# Patient Record
Sex: Male | Born: 2000 | Race: White | Hispanic: No | Marital: Single | Smoking: Never smoker
Health system: Southern US, Community
[De-identification: ages and names within clinical notes are randomized; demographics above are authoritative.]

## PROBLEM LIST (undated history)

## (undated) DIAGNOSIS — R55 Syncope and collapse: Secondary | ICD-10-CM

## (undated) DIAGNOSIS — J302 Other seasonal allergic rhinitis: Secondary | ICD-10-CM

## (undated) DIAGNOSIS — F900 Attention-deficit hyperactivity disorder, predominantly inattentive type: Secondary | ICD-10-CM

## (undated) DIAGNOSIS — F909 Attention-deficit hyperactivity disorder, unspecified type: Secondary | ICD-10-CM

## (undated) DIAGNOSIS — J3089 Other allergic rhinitis: Secondary | ICD-10-CM

## (undated) DIAGNOSIS — F39 Unspecified mood [affective] disorder: Secondary | ICD-10-CM

## (undated) DIAGNOSIS — Z0289 Encounter for other administrative examinations: Secondary | ICD-10-CM

## (undated) DIAGNOSIS — J310 Chronic rhinitis: Secondary | ICD-10-CM

## (undated) DIAGNOSIS — J45909 Unspecified asthma, uncomplicated: Secondary | ICD-10-CM

## (undated) DIAGNOSIS — J309 Allergic rhinitis, unspecified: Secondary | ICD-10-CM

## (undated) DIAGNOSIS — S67192A Crushing injury of right middle finger, initial encounter: Secondary | ICD-10-CM

## (undated) DIAGNOSIS — S62662A Nondisplaced fracture of distal phalanx of right middle finger, initial encounter for closed fracture: Secondary | ICD-10-CM

## (undated) DIAGNOSIS — Z209 Contact with and (suspected) exposure to unspecified communicable disease: Secondary | ICD-10-CM

## (undated) HISTORY — PX: NO PAST SURGERIES: SHX2092

---
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
## 2020-01-15 ENCOUNTER — Ambulatory Visit: Payer: Federal, State, Local not specified - PPO | Admitting: Mental Health

## 2020-01-15 NOTE — Progress Notes (Unsigned)
Psychotherapy Note  Name: Clifford Quinn Date: 12/19/19 MRN: [id number] DOB: 25-Jun-2001 PCP: Maudie Flakes, FNP  Time spent: 40 minutes  Treatment: Intervention  Mental Status Exam:   Appearance:   Casual     Behavior:  Appropriate  Motor:  Normal  Speech/Language:   Clear and Coherent  Affect:  Full Range  Mood:  euthymic  Thought process:  normal  Thought content:    WNL  Sensory/Perceptual disturbances:    WNL  Orientation:  x4  Attention:  Good  Concentration:  Good  Memory:  WNL  Fund of knowledge:   Good  Insight:    Good  Judgment:   Good  Impulse Control:  Good   Reported Symptoms:   Problems w/ focus/attention, procrastination, depressed mood, anxiety  Risk Assessment: Danger to Self:  No Self-injurious Behavior: No Danger to Others: No Duty to Warn:no Physical Aggression / Violence:No  Access to Firearms a concern: No  Gang Involvement:No  Patient / guardian was educated about steps to take if suicide or homicide risk level increases between visits: yes While future psychiatric events cannot be accurately predicted, the patient does not currently require acute inpatient psychiatric care and does not currently meet [facility] involuntary commitment criteria.    Medical History/Surgical History:  Past Medical History:  Diagnosis Date  . Syncope     Past Surgical History:  Procedure Laterality Date  . NO PAST SURGERIES      Medications: Current Outpatient Medications  Medication Sig Dispense Refill  . Diclofenac Sodium (PENNSAID) 2 % SOLN Place 1 application onto the skin 2 (two) times daily. (Patient not taking: Reported on 12/24/2019) 112 g 2  . VYVANSE 30 MG capsule Take 1 capsule by mouth daily.     No current facility-administered medications for this visit.    No Known Allergies  Subjective:   Patient presents for session, reporting he has been doing well, obtained another part-time job working in the evening until about 1 AM 5  days/week.  He stated he has had to cope with less sleep as he has an 8:00 class in the mornings but only has 2 weeks of class left and feels he can maintain this new job and school.  He continues to also work at his other job at the [employer].  He stated he continues to work out as this is one of his main outlets.  He struggles at times with focus and concentration while trying to get schoolwork completed.  He identified needing to be more cognizant of anything that would be a distraction during these instances.  He stated that his obtaining the second job has made his schedule even busier but this actually helps him stay more focused and effective in getting things done.  He denies feeling depressed recently, that he is between episodes continues to decline wanting to take any psychiatric medication to address depression but continues to take his medication to treat his ADHD.  He identified some of his thinking at times when stressed plays a role in his depressive episodes.  We provide him resource to review related to thought distortions to increase self awareness.  Interventions: CBT supportive therapy  Diagnoses:  No diagnosis found.  Plan: Patient is to use CBT, mindfulness and coping skills to help decrease symptoms.    Patient to maintain consistency with task completion by organizing his day, scheduling.  Patient to decrease procrastination by limiting exposure to any distractions that may cause him to get behind on  schoolwork.  Long-term goal:  Reduce overall level, frequency, and intensity of the feelings of depression and anxiety so that daily functioning is not impaired.  Short-term goal:  Maintaining a level of organization day-to-day to maintain consistency with his academic progress Verbalize an understanding of the role that distorted thinking plays in triggering and/or increasing his episodes of depression Patient to utilize coping skills as discussed in session Patient to  continue to take his psychiatric medication as clinically indicated and report any concerns to his prescribing provider  Assessment of progress:  progressing    [place], [facility]

## 2020-03-26 DIAGNOSIS — Z131 Encounter for screening for diabetes mellitus: Secondary | ICD-10-CM | POA: Diagnosis not present

## 2020-03-26 DIAGNOSIS — Z136 Encounter for screening for cardiovascular disorders: Secondary | ICD-10-CM | POA: Diagnosis not present

## 2020-03-26 DIAGNOSIS — Z0000 Encounter for general adult medical examination without abnormal findings: Secondary | ICD-10-CM | POA: Diagnosis not present

## 2020-03-26 DIAGNOSIS — Z1321 Encounter for screening for nutritional disorder: Secondary | ICD-10-CM | POA: Diagnosis not present

## 2020-03-26 DIAGNOSIS — Z130 Encounter for screening for diseases of the blood and blood-forming organs and certain disorders involving the immune mechanism: Secondary | ICD-10-CM | POA: Diagnosis not present

## 2020-03-26 DIAGNOSIS — Z1329 Encounter for screening for other suspected endocrine disorder: Secondary | ICD-10-CM | POA: Diagnosis not present

## 2020-05-28 DIAGNOSIS — R21 Rash and other nonspecific skin eruption: Secondary | ICD-10-CM | POA: Diagnosis not present

## 2020-06-02 IMAGING — DX DG SHOULDER 2+V*R*
3 series · 3 of 3 positions shown · non-contrast
Comparison: None.

CLINICAL DATA: Weight lifting injury.  Pain.

EXAM:
RIGHT SHOULDER - 2+ VIEW

[shoulder (grashey view) ap]
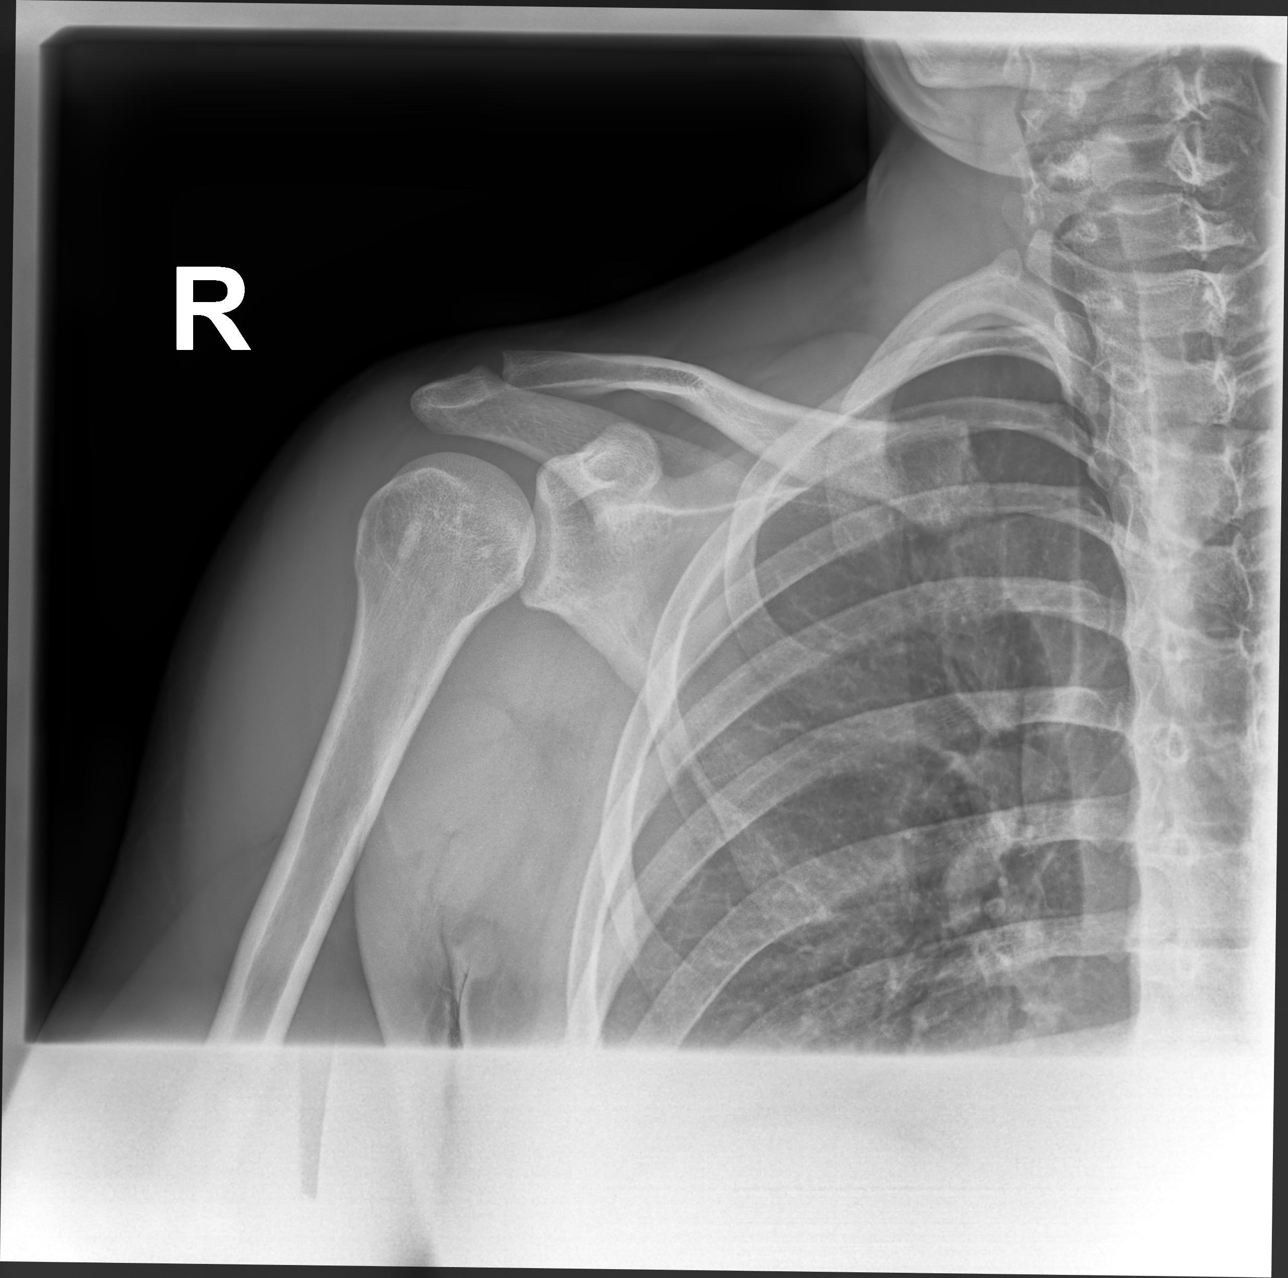

[shoulder (y view) lat]
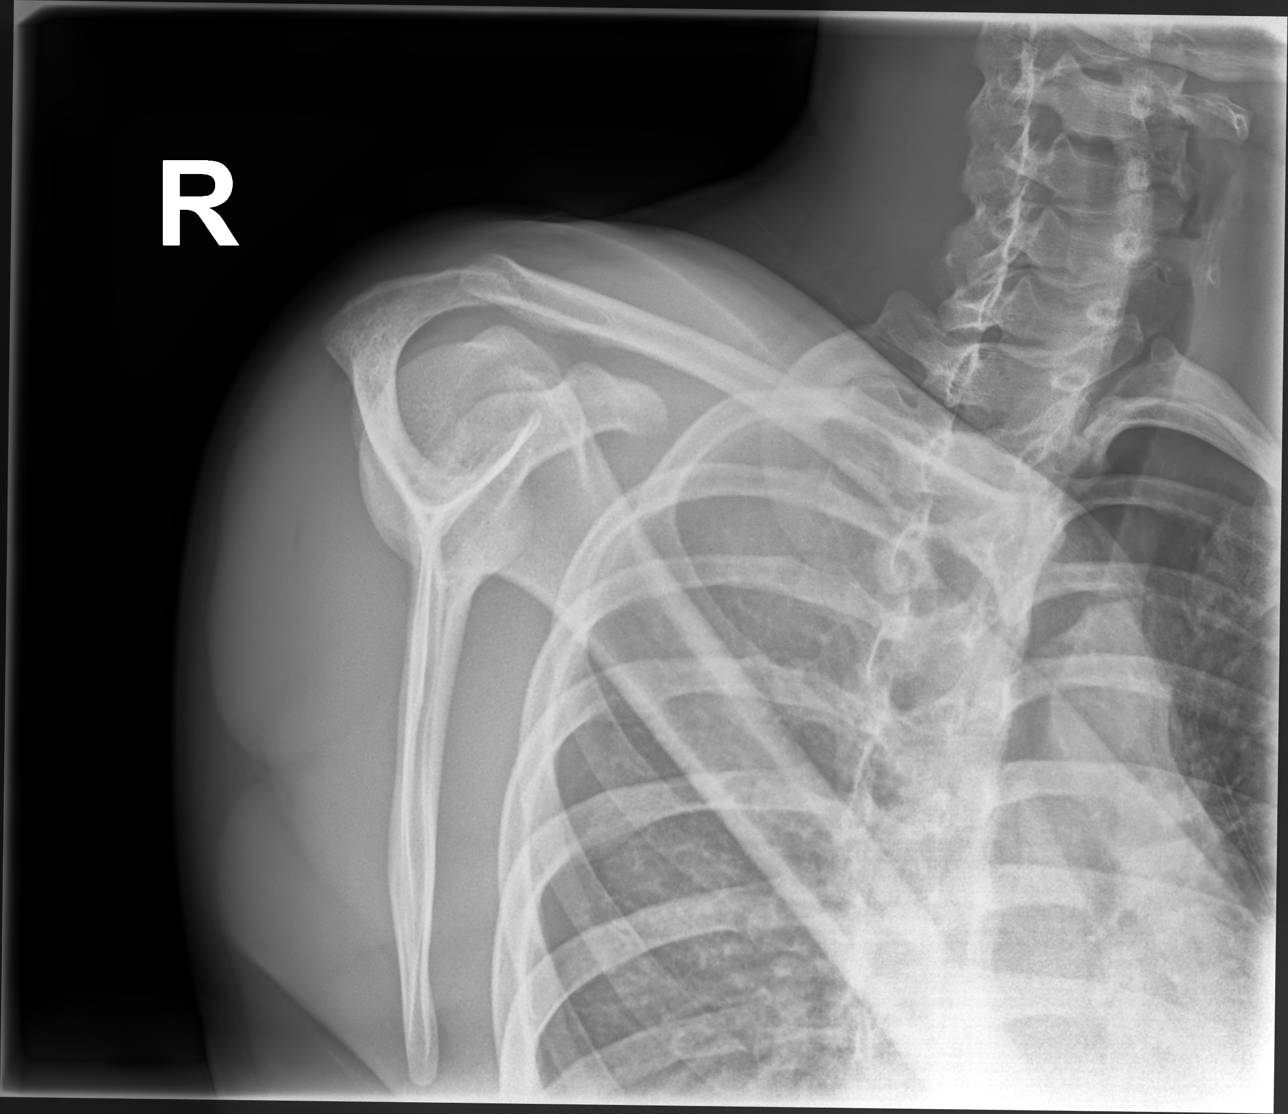

[shoulder axial 45°]
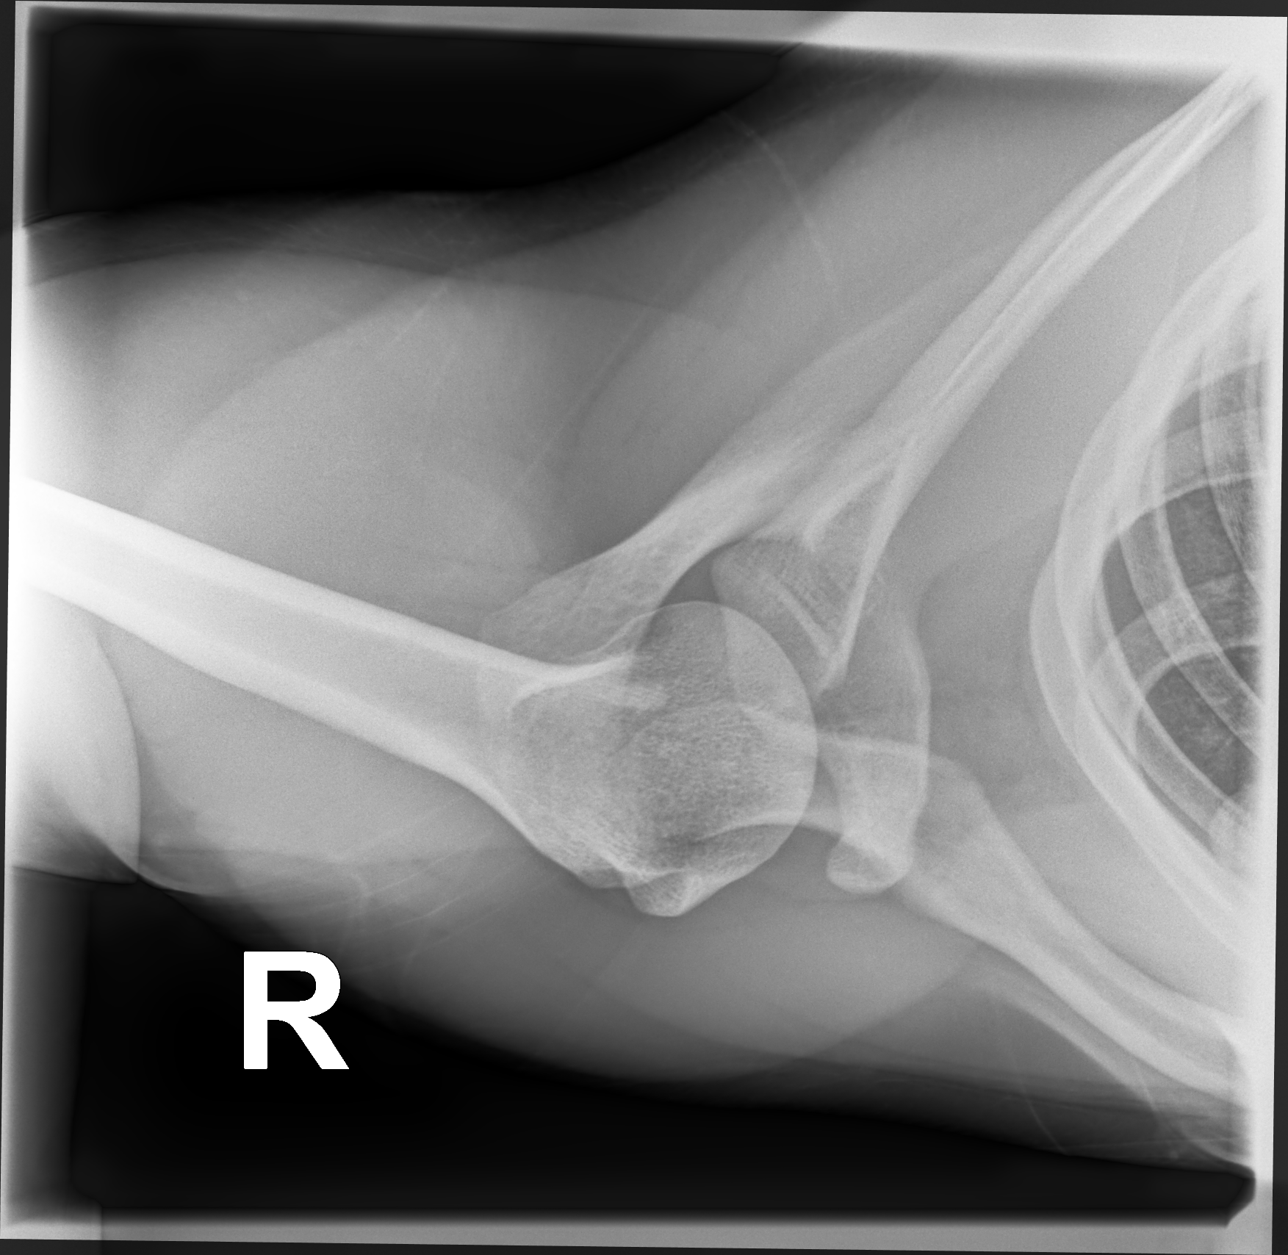

[3 of 3 positions shown; findings below may reference images not displayed]

FINDINGS: There is no evidence of fracture or dislocation. There is no
evidence of arthropathy or other focal bone abnormality. Soft
tissues are unremarkable.
IMPRESSION: Negative.

## 2020-07-02 DIAGNOSIS — J069 Acute upper respiratory infection, unspecified: Secondary | ICD-10-CM | POA: Diagnosis not present

## 2020-07-02 DIAGNOSIS — R059 Cough, unspecified: Secondary | ICD-10-CM | POA: Diagnosis not present

## 2020-09-08 DIAGNOSIS — Z20822 Contact with and (suspected) exposure to covid-19: Secondary | ICD-10-CM | POA: Diagnosis not present

## 2020-09-08 DIAGNOSIS — U071 COVID-19: Secondary | ICD-10-CM | POA: Diagnosis not present

## 2020-09-17 ENCOUNTER — Other Ambulatory Visit: Payer: Federal, State, Local not specified - PPO

## 2021-03-15 DIAGNOSIS — L603 Nail dystrophy: Secondary | ICD-10-CM | POA: Diagnosis not present

## 2021-03-15 DIAGNOSIS — B353 Tinea pedis: Secondary | ICD-10-CM | POA: Diagnosis not present

## 2021-07-20 ENCOUNTER — Encounter: Payer: Self-pay | Admitting: Family Medicine

## 2021-07-20 ENCOUNTER — Other Ambulatory Visit: Payer: Self-pay

## 2021-07-20 ENCOUNTER — Ambulatory Visit: Payer: Federal, State, Local not specified - PPO | Admitting: Family Medicine

## 2021-07-20 VITALS — BP 103/64 | HR 75 | Temp 97.9°F | Ht 69.29 in | Wt 204.8 lb

## 2021-07-20 DIAGNOSIS — Z23 Encounter for immunization: Secondary | ICD-10-CM | POA: Diagnosis not present

## 2021-07-20 DIAGNOSIS — F909 Attention-deficit hyperactivity disorder, unspecified type: Secondary | ICD-10-CM | POA: Diagnosis not present

## 2021-07-20 DIAGNOSIS — F800 Phonological disorder: Secondary | ICD-10-CM | POA: Diagnosis not present

## 2021-07-20 DIAGNOSIS — F951 Chronic motor or vocal tic disorder: Secondary | ICD-10-CM

## 2021-07-20 NOTE — Progress Notes (Signed)
Chief Complaint:  Clifford Quinn is a 20 y.o. male who presents today for his annual comprehensive physical exam. He is a new patient.   Assessment/Plan:  Chronic Problems Addressed Today: ADHD Stable off medications.   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   Chronic motor tic Has seen neurology in the past.  Symptoms are relatively stable.  He would like to reestablish with a neurologist.  Will place referral today.  Body mass index is 29.99 kg/m. / Overweight  BMI Metric Follow Up - 07/20/21 1005       BMI Metric Follow Up-Please document annually   BMI Metric Follow Up Education provided             Preventative Healthcare: Flu shot given today.   Patient Counseling(The following topics were reviewed and/or handout was given):  -Nutrition: Stressed importance of moderation in sodium/caffeine intake, saturated fat and cholesterol, caloric balance, sufficient intake of fresh fruits, vegetables, and fiber.  -Stressed the importance of regular exercise.   -Substance Abuse: Discussed cessation/primary prevention of tobacco, alcohol, or other drug use; driving or other dangerous activities under the influence; availability of treatment for abuse.   -Injury prevention: Discussed safety belts, safety helmets, smoke detector, smoking near bedding or upholstery.   -Sexuality: Discussed sexually transmitted diseases, partner selection, use of condoms, avoidance of unintended pregnancy and contraceptive alternatives.   -Dental health: Discussed importance of regular tooth brushing, flossing, and dental visits.  -Health maintenance and immunizations reviewed. Please refer to Health maintenance section.  Return to care in 1 year for next preventative visit.     Subjective:  HPI:  He has no acute complaints today.   Lifestyle Diet: Balanced.  Exercise: Lifts weights.   No flowsheet data found.  Health Maintenance Due  Topic Date Due   HIV  Screening  Never done   Hepatitis C Screening  Never done   COVID-19 Vaccine (3 - Booster for Pfizer series) 04/06/2020   INFLUENZA VACCINE  04/05/2021     ROS: Per HPI, otherwise a complete review of systems was negative.   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   No current facility-administered medications for this visit.    Allergies-reviewed and updated No Known Allergies  Social History   Socioeconomic History   Marital status: Single    Spouse name: Not on file   Number of children: Not on file   Years of education: Not on file   Highest education level: Not on file  Occupational History   Not on file  Tobacco Use   Smoking status: Never   Smokeless tobacco: Never  Vaping Use   Vaping Use: Never used  Substance and Sexual Activity   Alcohol use: Yes    Comment: OCC   Drug use: Never   Sexual activity: Not on file  Other Topics Concern   Not on file  Social History Narrative   Not on file   Social Determinants of Health   Financial Resource Strain: Not on file  Food Insecurity: Not on file  Transportation Needs: Not on file  Physical Activity: Not on file  Stress: Not on file  Social Connections: Not on file        Objective:  Physical Exam: BP 103/64   Pulse 75   Temp 97.9 F (36.6 C) (Temporal)   Ht 5' 9.29" (1.76 m)   Wt 204 lb 12.8 oz (92.9 kg)   SpO2 97%   BMI 29.99 kg/m    Body mass index is 29.99 kg/m. Wt Readings from Last 3 Encounters:  07/20/21 204 lb 12.8 oz (92.9 kg)  12/24/19 196 lb 6.4 oz (89.1 kg) (92 %, Z= 1.41)*  11/12/19 195 lb 6.4 oz (88.6 kg) (92 %, Z= 1.40)*   * Growth percentiles are based on CDC (Boys, 2-20 Years) data.   Gen: NAD, resting comfortably HEENT: TMs normal bilaterally. OP clear. No thyromegaly noted.  CV: RRR with no murmurs appreciated Pulm: NWOB, CTAB with no crackles, wheezes, or rhonchi GI: Normal bowel sounds present. Soft, Nontender, Nondistended. MSK: no edema, cyanosis, or clubbing noted Skin: warm, dry Neuro: CN2-12 grossly intact. Strength 5/5 in upper and lower extremities. Reflexes symmetric and intact bilaterally.  Psych: Normal affect and thought content     Terrianna Holsclaw M. Jimmey Ralph, MD 07/20/2021 10:05 AM

## 2021-07-20 NOTE — Patient Instructions (Signed)
It was very nice to see you today!  I will refer you to see a speech therapist and a neurologist.  Please come back in 1 year. Come back sooner if needed.   Take care, Dr Jimmey Ralph  PLEASE NOTE:  If you had any lab tests please let us know if you have not heard back within a few days. You may see your results on mychart before we have a chance to review them but we will give you a call once they are reviewed by [place]. If we ordered any referrals today, please let us know if you have not heard from their office within the next week.   Please try these tips to maintain a healthy lifestyle:  Eat at least 3 REAL meals and 1-2 snacks per day.  Aim for no more than 5 hours between eating.  If you eat breakfast, please do so within one hour of getting up.   Each meal should contain half fruits/vegetables, one quarter protein, and one quarter carbs (no bigger than a computer mouse)  Cut down on sweet beverages. This includes juice, soda, and sweet tea.   Drink at least 1 glass of water with each meal and aim for at least 8 glasses per day  Exercise at least 150 minutes every week.    Preventive Care 40-35 Years Old, Male Preventive care refers to lifestyle choices and visits with your health care provider that can promote health and wellness. At this stage in your life, you may start seeing a primary care physician instead of a pediatrician for your preventive care. Preventive care visits are also called wellness exams. What can I expect for my preventive care visit? Counseling During your preventive care visit, your health care provider may ask about your: Medical history, including: Past medical problems. Family medical history. Current health, including: Home life and relationship well-being. Emotional well-being. Sexual activity and sexual health. Lifestyle, including: Alcohol, nicotine or tobacco, and drug use. Access to firearms. Diet, exercise, and sleep habits. Sunscreen  use. Motor vehicle safety. Physical exam Your health care provider may check your: Height and weight. These may be used to calculate your BMI (body mass index). BMI is a measurement that tells if you are at a healthy weight. Waist circumference. This measures the distance around your waistline. This measurement also tells if you are at a healthy weight and may help predict your risk of certain diseases, such as type 2 diabetes and high blood pressure. Heart rate and blood pressure. Body temperature. Skin for abnormal spots. What immunizations do I need? Vaccines are usually given at various ages, according to a schedule. Your health care provider will recommend vaccines for you based on your age, medical history, and lifestyle or other factors, such as travel or where you work. What tests do I need? Screening Your health care provider may recommend screening tests for certain conditions. This may include: Vision and hearing tests. Lipid and cholesterol levels. Hepatitis B test. Hepatitis C test. HIV (human immunodeficiency virus) test. STI (sexually transmitted infection) testing, if you are at risk. Tuberculosis skin test. Talk with your health care provider about your test results, treatment options, and if necessary, the need for more tests. Follow these instructions at home: Eating and drinking  Eat a healthy diet that includes fresh fruits and vegetables, whole grains, lean protein, and low-fat dairy products. Drink enough fluid to keep your urine pale yellow. Do not drink alcohol if: Your health care provider tells you not to  drink. You are under the legal drinking age. In the [place] the legal drinking age is 80. If you drink alcohol: Limit how much you have to 0-2 drinks a day. Know how much alcohol is in your drink. In the [place] one drink equals one 12 oz bottle of beer (355 mL), one 5 oz glass of wine (148 mL), or one 1 oz glass of hard liquor (44 mL). Lifestyle Brush your  teeth every morning and night with fluoride toothpaste. Floss one time each day. Exercise for at least 30 minutes 5 or more days of the week. Do not use any products that contain nicotine or tobacco. These products include cigarettes, chewing tobacco, and vaping devices, such as e-cigarettes. If you need help quitting, ask your health care provider. Do not use drugs. If you are sexually active, practice safe sex. Use a condom or other form of protection to prevent STIs. Find healthy ways to manage stress, such as: Meditation, yoga, or listening to music. Journaling. Talking to a trusted person. Spending time with friends and family. Safety Always wear your seat belt while driving or riding in a vehicle. Do not drive: If you have been drinking alcohol. Do not ride with someone who has been drinking. When you are tired or distracted. While texting. If you have been using any mind-altering substances or drugs. Wear a helmet and other protective equipment during sports activities. If you have firearms in your house, make sure you follow all gun safety procedures. Seek help if you have been bullied, physically abused, or sexually abused. Use the internet responsibly to avoid dangers, such as online bullying and online sex predators. What's next? Go to your health care provider once a year for an annual wellness visit. Ask your health care provider how often you should have your eyes and teeth checked. Stay up to date on all vaccines. This information is not intended to replace advice given to you by your health care provider. Make sure you discuss any questions you have with your health care provider. Document Revised: 02/17/2021 Document Reviewed: 02/17/2021 Elsevier Patient Education  2022 [employer].

## 2021-07-20 NOTE — Assessment & Plan Note (Signed)
Will place referral to speech therapy.

## 2021-07-20 NOTE — Assessment & Plan Note (Signed)
Has seen neurology in the past.  Symptoms are relatively stable.  He would like to reestablish with a neurologist.  Will place referral today.

## 2021-07-20 NOTE — Assessment & Plan Note (Signed)
Stable off medications.  

## 2021-08-09 ENCOUNTER — Ambulatory Visit: Payer: Federal, State, Local not specified - PPO | Attending: Family Medicine

## 2021-08-09 ENCOUNTER — Other Ambulatory Visit: Payer: Self-pay

## 2021-08-09 DIAGNOSIS — R479 Unspecified speech disturbances: Secondary | ICD-10-CM | POA: Diagnosis not present

## 2021-08-09 NOTE — Patient Instructions (Signed)
Speech Exercises Say this list TWICE a day Focus on FEELING EVERY SOUND in order to better meter your speech rate  Call the cat "Buttercup" A calendar of [place], [place] Four floors to cover Yellow oil ointment Fellow lovers of felines Catastrophe in [place] Plump plumbers' plums The church's chimes chimed Telling time 'til eleven Five valve levers Keep the gate closed Go see that guy Fat cows give milk Automatic Data Gophers Fat frogs flip freely [employer] into bed Get that game to Greg Thick thistles stick together Cinnamon aluminum linoleum Black bugs blood Lovely lemon linament Red leather, yellow leather  Big grocery buggy    Purple baby carriage [facility] Proper copper coffee pot Ripe purple cabbage Three free throws [employer] tackled  [employer] dipped the dessert  [employer] Buckle that [employer] [place] Shirts shrink, shells shouldn't [place] [employer] Take the tackle box File the flash message Give me five flapjacks Fundamental relatives Dye the pets purple Talking [place] time after time Dark chocolate chunks Political landscape of the kingdom Actuary genius We played yo-yos yesterday

## 2021-08-10 NOTE — Therapy (Signed)
[facility] [address], [address] [place], [place], [address] Phone: [phone]   Fax:  [phone]  Speech Language Pathology Evaluation  Patient Details  Name: Clifford Quinn MRN: [id number] Date of Birth: August 29, 2001 Referring Provider (SLP): Jacquiline Doe, MD   Encounter Date: 08/09/2021   End of Session - 08/10/21 1142     Visit Number 1    Number of Visits 9    Date for SLP Re-Evaluation 10/09/21    SLP Start Time 1450    SLP Stop Time  1530    SLP Time Calculation (min) 40 min    Activity Tolerance Patient tolerated treatment well             Past Medical History:  Diagnosis Date   Syncope     Past Surgical History:  Procedure Laterality Date   NO PAST SURGERIES      There were no vitals filed for this visit.   Subjective Assessment - 08/10/21 1126     Subjective "I just jumble and stutter my words sometimes. I speak so fast."    Currently in Pain? No/denies                SLP Evaluation [facility] - 08/10/21 1126       SLP Visit Information   SLP Received On 08/09/21    Referring Provider (SLP) Jacquiline Doe, MD    Onset Date "ever since I can remember"    Medical Diagnosis disarticulation of speech      Subjective   Patient/Family Stated Goal improve speech intelligibility      General Information   HPI Pt with long-standing difficulty with speech clarity/intelligibility. Concerned when he applies for jobs what employers will think. Pertinent PMH: ADHD (unmedicated, currently), GERD, chronic motor tic.      Prior Functional Status   Cognitive/Linguistic Baseline Within functional limits    Type of Home House    Education technical college    Vocation Full time IT sales professional   Overall Cognitive Status Within Functional Limits for tasks assessed   impulsive - ADHD   Attention --   pt unmedicated ADHD - presented with some decr'd sustained attention     Auditory  Comprehension   Overall Auditory Comprehension Appears within functional limits for tasks assessed      Verbal Expression   Overall Verbal Expression Appears within functional limits for tasks assessed      Oral Motor/Sensory Function   Overall Oral Motor/Sensory Function Appears within functional limits for tasks assessed      Motor Speech   Overall Motor Speech Impaired at baseline    Respiration Within functional limits    Phonation Normal    Resonance Within functional limits    Articulation Impaired    Level of Impairment Phrase    Intelligibility Intelligibility reduced    Conversation Other (comment)   when "metering" his speech rate, intelligibility 90%, when not "metering" speech intelligibility =64%. "Intelligible"= via digital recording, the patient's message was adequately communicated (either understood on first listen or with focused listening)                            SLP Education - 08/10/21 1141     Education Details HEP procedure, "metering" is essential for improving pt's intelligibility/clarity    Person(s) Educated Patient    Methods Explanation    Comprehension Verbalized understanding;Need  further instruction              SLP Short Term Goals - 08/10/21 1340       SLP SHORT TERM GOAL #1   Title pt will perform HEP for overarticulation with 75% success    Time 4    Period Weeks    Status New    Target Date 09/10/21      SLP SHORT TERM GOAL #2   Title pt will demo 95% intelligibilty in 20 sentence responses    Time 4    Period Weeks    Target Date 09/10/21      SLP SHORT TERM GOAL #3   Title pt will exhibit 90% intelligibility in 10 minutes simple conversation    Time 4    Period Weeks    Status New    Target Date 09/10/21              SLP Long Term Goals - 08/10/21 1342       SLP LONG TERM GOAL #1   Title pt will exhibit 90%+ intelligibility in 10 minutes simple-mod complex conversation in 2 sessions     Time 8    Period Weeks    Status New    Target Date 10/08/21      SLP LONG TERM GOAL #2   Title pt will complete HEP for overarticulation with 80% success in 2 sessions    Time 8    Period Weeks    Status New    Target Date 10/08/21      SLP LONG TERM GOAL #3   Title pt will demo knowledge of how ADHD meds could enhance pt's speech intelligibility    Time 8    Period Weeks    Status New    Target Date 10/08/21              Plan - 08/10/21 1142     Clinical Impression Statement Trini presents today 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 Measurements taken today rated speech intelligibility at 90% when he is "metering" vs. 64% when not "metering" his speech. Pt has a dx of ADHD and currently is not medicated. SLP suggested pt talk with his MD about medication for ADHD which may help to decr some of the impulsivity shown to SLP today in the session, which may decrease his speech rate and improve his speech intelligibility. He agreed that a course of ST targeting habitualization of slowing his speech rate ("metering") would be helpful for him to begin job interviews after he graduates from [employer] in the next 6 months. SLP believes this would be beneficial for him both in teh community and in other social situations.    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 [place], [place], [address] Phone: [phone]   Fax:  [phone]  Name: Clifford Quinn MRN: [id number] Date of Birth: [birth year]

## 2021-08-23 ENCOUNTER — Other Ambulatory Visit: Payer: Self-pay

## 2021-08-23 ENCOUNTER — Ambulatory Visit: Payer: Federal, State, Local not specified - PPO

## 2021-08-23 DIAGNOSIS — R479 Unspecified speech disturbances: Secondary | ICD-10-CM

## 2021-08-23 NOTE — Therapy (Signed)
[facility] [address], [address] [place], [place], [address] Phone: [phone]   Fax:  [phone]  Speech Language Pathology Treatment  Patient Details  Name: Clifford Quinn MRN: [id number] Date of Birth: 2001/05/12 Referring Provider (SLP): Jacquiline Doe, MD   Encounter Date: 08/23/2021   End of Session - 08/23/21 1742     Visit Number 2    Number of Visits 9    Date for SLP Re-Evaluation 10/09/21    SLP Start Time 1022    SLP Stop Time  1102    SLP Time Calculation (min) 40 min    Activity Tolerance Patient tolerated treatment well             Past Medical History:  Diagnosis Date   Syncope     Past Surgical History:  Procedure Laterality Date   NO PAST SURGERIES      There were no vitals filed for this visit.   Subjective Assessment - 08/23/21 1029     Subjective "I think I've been metering my speech a bit more."    Currently in Pain? 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 Pt indicated he was not satisfied with this status and so SLP engaged pt at the sentence response level to target slowed speech ("metered speech"). In 2-3 sentence responses pt produced intelligible message 70% of the time; 30% of the time pt SLP had to ask pt to repeat his utterance. Pt was provided homework for slowed speech rate.      Assessment / Recommendations / Plan   Plan Continue with current plan of care      Progression Toward Goals   Progression toward goals Progressing toward goals                SLP Short Term Goals - 08/23/21 1752        SLP SHORT TERM GOAL #1   Title pt will perform HEP for overarticulation with 75% success    Time 4    Period Weeks    Status On-going    Target Date 09/10/21      SLP SHORT TERM GOAL #2   Title pt will demo 95% intelligibilty in 20 sentence responses    Time 4    Period Weeks    Status On-going    Target Date 09/10/21      SLP SHORT TERM GOAL #3   Title pt will exhibit 90% intelligibility in 10 minutes simple conversation    Time 4    Period Weeks    Status On-going    Target Date 09/10/21              SLP Long Term Goals - 08/23/21 1752       SLP LONG TERM GOAL #1   Title pt will exhibit 90%+ intelligibility in 10 minutes simple-mod complex conversation in 2 sessions    Time 8    Period Weeks    Status On-going    Target Date 10/08/21  SLP LONG TERM GOAL #2   Title pt will complete HEP for overarticulation with 80% success in 2 sessions    Time 8    Period Weeks    Status On-going    Target Date 10/08/21      SLP LONG TERM GOAL #3   Title pt will demo knowledge of how ADHD meds could enhance pt's speech intelligibility    Time 8    Period Weeks    Status On-going    Target Date 10/08/21              Plan - 08/23/21 1743     Clinical Impression Statement Clifford Quinn presents today 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 Pt has a dx of ADHD and currently is not medicated. Clifford Quinn tells SLP today that he is not satisfied with his speech and he believes SLP needs to assist him to reduce his speech rate -  SLP continues to believe this would be beneficial for him both in the community and in other social situations.    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, [address] [place], [place], [address] Phone: [phone]   Fax:  [phone]   Name: Clifford Quinn MRN: [id number] Date of Birth: 25-Aug-2001

## 2021-08-23 NOTE — Patient Instructions (Signed)
  Please complete the assigned speech therapy homework prior to your next session and return it to the speech therapist at your next visit.  

## 2021-09-07 ENCOUNTER — Ambulatory Visit: Payer: Federal, State, Local not specified - PPO | Attending: Family Medicine

## 2021-09-07 ENCOUNTER — Other Ambulatory Visit: Payer: Self-pay

## 2021-09-07 DIAGNOSIS — R479 Unspecified speech disturbances: Secondary | ICD-10-CM | POA: Diagnosis not present

## 2021-09-07 NOTE — Therapy (Signed)
[facility] [address], [address] [place], [place], [address] Phone: [phone]   Fax:  [phone]  Speech Language Pathology Treatment  Patient Details  Name: Clifford Quinn MRN: [id number] Date of Birth: [birth year] Referring Provider (SLP): Jacquiline Doe, MD   Encounter Date: 09/07/2021   End of Session - 09/07/21 1025     Visit Number 3    Number of Visits 9    Date for SLP Re-Evaluation 10/09/21    SLP Start Time 0934    SLP Stop Time  1012    SLP Time Calculation (min) 38 min    Activity Tolerance Patient tolerated treatment well             Past Medical History:  Diagnosis Date   Syncope     Past Surgical History:  Procedure Laterality Date   NO PAST SURGERIES      There were no vitals filed for this visit.   Subjective Assessment - 09/07/21 0938     Subjective "I even noticed myself, 'Oh no I was rushing through my talking.'"    Currently in Pain? 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 In 2 more 12-14 minute conversations about mod complex/complex topics pt maintained intelligibility of 100% out doors. He was agreeable to a 4 week interim appointment to ensure sucess continued.      Assessment / Recommendations / Plan   Plan Continue with current plan of care      Progression Toward Goals   Progression toward goals Progressing toward goals                SLP Short Term Goals - 09/07/21 1026        SLP SHORT TERM GOAL #1   Title pt will perform HEP for overarticulation with 75% success    Time 4    Period Weeks    Status On-going    Target Date 09/10/21      SLP SHORT TERM GOAL #2   Title pt will demo 95% intelligibilty in 20 sentence responses    Status Achieved    Target Date 09/10/21      SLP SHORT TERM GOAL #3   Title pt will exhibit 90% intelligibility in 10 minutes simple conversation    Status Achieved    Target Date 09/10/21              SLP Long Term Goals - 09/07/21 1026       SLP LONG TERM GOAL #1   Title pt will exhibit 90%+ intelligibility in 10 minutes simple-mod complex conversation in 2 sessions    Baseline 09-07-21    Time 8    Period Weeks    Status On-going    Target Date 10/08/21      SLP LONG TERM GOAL #2   Title pt will complete HEP for overarticulation with 80% success in 2  sessions    Time 8    Period Weeks    Status On-going    Target Date 10/08/21      SLP LONG TERM GOAL #3   Title pt will demo knowledge of how ADHD meds could enhance pt's speech intelligibility    Time 8    Period Weeks    Status On-going    Target Date 10/08/21              Plan - 09/07/21 1025     Clinical Impression Statement [name] presents today with improving articualtory precision, due to more frequent/subconscious "metering" (slowing) his speech rate. Pt has a dx of ADHD and currently is not medicated.  SLP continues to believe skilled ST would be beneficial for him both in the community and in other social situations.    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, [address] [place], [place], [address] Phone: [phone]   Fax:  [phone]   Name: Clifford Quinn MRN: [id number] Date of Birth: 05/20/2001

## 2021-10-04 ENCOUNTER — Ambulatory Visit: Payer: Federal, State, Local not specified - PPO

## 2021-10-04 ENCOUNTER — Other Ambulatory Visit: Payer: Self-pay

## 2021-10-04 DIAGNOSIS — R479 Unspecified speech disturbances: Secondary | ICD-10-CM

## 2021-10-04 NOTE — Therapy (Signed)
[facility] [place] [address], [place], [place], [address] Phone: [phone]   Fax:  [phone]  Speech Language Pathology Treatment  Patient Details  Name: Clifford Quinn MRN: [id number] Date of Birth: [birth year] Referring Provider (SLP): Dimas Chyle, MD   Encounter Date: 10/04/2021   End of Session - 10/04/21 0848     Visit Number 4    Number of Visits 9    Date for SLP Re-Evaluation 10/09/21    SLP Start Time 0804    SLP Stop Time  0840    SLP Time Calculation (min) 36 min    Activity Tolerance Patient tolerated treatment well             Past Medical History:  Diagnosis Date   Syncope     Past Surgical History:  Procedure Laterality Date   NO PAST SURGERIES      There were no vitals filed for this visit.   Subjective Assessment - 10/04/21 0845     Subjective "My family and friends they know me so well it doeesn't really affect them." (pt's fast rate of speech)    Currently in Pain? 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 He is "metering" his speech to ensure he continues to reduce his rate to improve intelligibility at work and feels/thinks this is going much better than prior to [place]. In 15 minutes mod complex conversation pt monitored his speech resulting in only one instance where SLP req'd focused listening. Another 2 times pt self-corrected. Clifford Quinn states he feels good about being involved in job interviews with his current speech but desires one last appointment to ensure success with slowed rate of speech. SLP told pt next/last session  would also include a mock job interview. Pt stated that was a good idea.      Assessment / Recommendations / Plan   Plan Continue with current plan of care   likely d/c in next session- pt desires one more appointment for ensuring slowed rate continues     Progression Toward Goals   Progression toward goals Progressing toward goals                SLP Short Term Goals - 10/04/21 0811       SLP SHORT TERM GOAL #1   Title pt will perform HEP for overarticulation with 75% success    Time 4    Period Weeks    Status Achieved    Target Date 09/10/21      SLP SHORT TERM GOAL #2   Title pt will demo 95% intelligibilty in 20 sentence responses    Status Achieved    Target Date 09/10/21      SLP SHORT TERM GOAL #3   Title pt will exhibit 90% intelligibility in 10 minutes simple conversation    Status Achieved    Target Date 09/10/21              SLP Long Term Goals - 10/04/21 0816       SLP  LONG TERM GOAL #1   Title pt will exhibit 90%+ intelligibility in 10 minutes simple-mod complex conversation in 3 sessions    Baseline 09-07-21    Status Revised      SLP LONG TERM GOAL #2   Title pt will complete HEP for overarticulation with 80% success in 2 sessions    Baseline 10-04-21    Time 8    Period Weeks    Status On-going      SLP LONG TERM GOAL #3   Title pt will demo knowledge of how ADHD meds could enhance pt's speech intelligibility    Status Achieved              Plan - 10/04/21 0848     Clinical Impression Statement Clifford Quinn presents today with improving articualtory precision, due to more frequent/subconscious "metering" (slowing) his speech rate. Pt has not checked yet on medication for ADHD but thinks this might be a good idea. SLP continues to believe skilled ST would be beneficial for him both in the community and in other social situations. Next session likely will be his final session.    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 [place], [place], [address] Phone: [phone]   Fax:  [phone]   Name: Iris Mcquillan MRN: [id number] Date of Birth: 08/11/2001

## 2021-11-01 ENCOUNTER — Ambulatory Visit: Payer: Federal, State, Local not specified - PPO | Attending: Family Medicine

## 2021-11-01 ENCOUNTER — Other Ambulatory Visit: Payer: Self-pay

## 2021-11-01 DIAGNOSIS — R479 Unspecified speech disturbances: Secondary | ICD-10-CM | POA: Insufficient documentation

## 2021-11-01 NOTE — Therapy (Signed)
[facility] [address], [place], [place], [address] Phone: [phone]   Fax:  [phone]  Speech Language Pathology Treatment/Renewal/D/C note  Patient Details  Name: Clifford Quinn MRN: [id number] Date of Birth: 19-May-2001 Referring Provider (SLP): Dimas Chyle, MD   Encounter Date: 11/01/2021   End of Session - 11/01/21 1013     Visit Number 5    Number of Visits 9    Date for SLP Re-Evaluation 11/01/21    SLP Start Time 0934    SLP Stop Time  0959    SLP Time Calculation (min) 25 min    Activity Tolerance Patient tolerated treatment well             Past Medical History:  Diagnosis Date   Syncope     Past Surgical History:  Procedure Laterality Date   NO PAST SURGERIES      There were no vitals filed for this visit.  SPEECH THERAPY DISCHARGE SUMMARY  Visits from Start of Care: 5  Current functional level related to goals / functional outcomes: See below.  Pt will have the below goals inforce today and then will be d/c'd. He is satisfied with progress and feels he is ready for d/c.   Remaining deficits: Mild speech difficulty noted, intermittently.   Education / Equipment: Compensations for incr'd speech rate.   Patient agrees to discharge. Patient goals were partially met. Patient is being discharged due to being pleased with the current functional level..      Subjective Assessment - 11/01/21 0937     Subjective "I think the overall trend (with slowed rate) is better."    Currently in Pain? 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 With his friends adn family pt endorses he has had more success with "metering" (slowing) his speech to improve intelligibility. In 15 mintues conversation pt demonstrated slowed rate of speech and 100% intelligibility. He agrees with d/c at this time.      Assessment / Recommendations / Plan   Plan Discharge SLP treatment due to (comment)   met goals/pt satisfied with current levels     Progression Toward Goals   Progression toward goals --   d/c day- see goals               SLP Short Term Goals - 10/04/21 0811       SLP SHORT TERM GOAL #1   Title pt will perform HEP for overarticulation with 75% success    Time 4    Period Weeks    Status Achieved    Target Date 09/10/21      SLP SHORT TERM GOAL #2   Title pt will demo 95% intelligibilty in 20 sentence responses    Status Achieved    Target Date 09/10/21      SLP SHORT TERM GOAL #3   Title pt will exhibit 90% intelligibility in 10 minutes simple conversation    Status Achieved    Target Date 09/10/21  SLP Long Term Goals - 11/01/21 1014       SLP LONG TERM GOAL #1   Title pt will exhibit 90%+ intelligibility in 10 minutes simple-mod complex conversation in 3 sessions    Baseline 09-07-21, 11-01-21    Status Partially Met      SLP LONG TERM GOAL #2   Title pt will complete HEP for overarticulation with 80% success in 2 sessions    Baseline 10-04-21    Status Deferred      SLP LONG TERM GOAL #3   Title pt will demo knowledge of how ADHD meds could enhance pt's speech intelligibility    Status Achieved              Plan - 11/01/21 [place] presents today with improved articualtory precision, due to more frequent/subconscious "metering" (slowing) his speech rate.Pt agrees with d/c today    Treatment/Interventions Internal/external aids;Patient/family education;Compensatory strategies;SLP instruction and  feedback;Functional tasks    Potential to Achieve Goals Good    Potential Considerations --   ADHD dx   Consulted and Agree with Plan of Care Patient             Patient will benefit from skilled therapeutic intervention in order to improve the following deficits and impairments:   Speech disorder    Problem List Patient Active Problem List   Diagnosis Date Noted   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 [place], [place], [address] Phone: [phone]   Fax:  [phone]   Name: Clifford Quinn MRN: [id number] Date of Birth: 06-29-2001

## 2022-05-10 DIAGNOSIS — K080 Exfoliation of teeth due to systemic causes: Secondary | ICD-10-CM | POA: Diagnosis not present

## 2022-05-30 ENCOUNTER — Encounter: Payer: Self-pay | Admitting: *Deleted

## 2022-06-05 ENCOUNTER — Encounter: Payer: Self-pay | Admitting: Family Medicine

## 2022-06-06 NOTE — Telephone Encounter (Signed)
Please advise 

## 2022-06-07 ENCOUNTER — Other Ambulatory Visit: Payer: Self-pay | Admitting: *Deleted

## 2022-06-07 DIAGNOSIS — Z1339 Encounter for screening examination for other mental health and behavioral disorders: Secondary | ICD-10-CM

## 2022-06-07 NOTE — Telephone Encounter (Signed)
We can place a referral.  Clifford Quinn M. Jerline Pain, MD 06/07/2022 10:55 AM

## 2022-06-07 NOTE — Telephone Encounter (Signed)
Referral placed.

## 2022-06-24 ENCOUNTER — Telehealth: Payer: Self-pay | Admitting: Family Medicine

## 2022-06-24 NOTE — Telephone Encounter (Signed)
Patient is requesting transfer of care from Dr. Jerline Pain to Dr. Mitchel Honour. Please advise.

## 2022-06-25 NOTE — Telephone Encounter (Signed)
Negative. I decline this transfer of care. Not accepted. Thanks.

## 2022-07-25 ENCOUNTER — Encounter: Payer: Federal, State, Local not specified - PPO | Admitting: Family Medicine

## 2022-08-18 ENCOUNTER — Encounter: Payer: Self-pay | Admitting: *Deleted

## 2022-12-29 DIAGNOSIS — S86112A Strain of other muscle(s) and tendon(s) of posterior muscle group at lower leg level, left leg, initial encounter: Secondary | ICD-10-CM | POA: Diagnosis not present

## 2022-12-29 DIAGNOSIS — X501XXA Overexertion from prolonged static or awkward postures, initial encounter: Secondary | ICD-10-CM | POA: Diagnosis not present

## 2023-03-13 DIAGNOSIS — F419 Anxiety disorder, unspecified: Secondary | ICD-10-CM | POA: Diagnosis not present

## 2023-03-13 DIAGNOSIS — F902 Attention-deficit hyperactivity disorder, combined type: Secondary | ICD-10-CM | POA: Diagnosis not present

## 2023-03-13 DIAGNOSIS — F951 Chronic motor or vocal tic disorder: Secondary | ICD-10-CM | POA: Diagnosis not present

## 2023-03-13 DIAGNOSIS — R55 Syncope and collapse: Secondary | ICD-10-CM | POA: Diagnosis not present

## 2023-03-22 DIAGNOSIS — F902 Attention-deficit hyperactivity disorder, combined type: Secondary | ICD-10-CM | POA: Diagnosis not present

## 2023-03-22 DIAGNOSIS — F32A Depression, unspecified: Secondary | ICD-10-CM | POA: Diagnosis not present

## 2023-03-22 DIAGNOSIS — F951 Chronic motor or vocal tic disorder: Secondary | ICD-10-CM | POA: Diagnosis not present

## 2023-03-22 DIAGNOSIS — F419 Anxiety disorder, unspecified: Secondary | ICD-10-CM | POA: Diagnosis not present

## 2023-05-23 DIAGNOSIS — F952 Tourette's disorder: Secondary | ICD-10-CM | POA: Diagnosis not present

## 2023-05-23 DIAGNOSIS — F902 Attention-deficit hyperactivity disorder, combined type: Secondary | ICD-10-CM | POA: Diagnosis not present

## 2023-05-23 DIAGNOSIS — F32A Depression, unspecified: Secondary | ICD-10-CM | POA: Diagnosis not present

## 2023-06-09 DIAGNOSIS — F32A Depression, unspecified: Secondary | ICD-10-CM | POA: Diagnosis not present

## 2023-06-09 DIAGNOSIS — F419 Anxiety disorder, unspecified: Secondary | ICD-10-CM | POA: Diagnosis not present

## 2023-06-09 DIAGNOSIS — R55 Syncope and collapse: Secondary | ICD-10-CM | POA: Diagnosis not present

## 2023-06-09 DIAGNOSIS — Z13220 Encounter for screening for lipoid disorders: Secondary | ICD-10-CM | POA: Diagnosis not present

## 2023-06-14 DIAGNOSIS — F951 Chronic motor or vocal tic disorder: Secondary | ICD-10-CM | POA: Diagnosis not present

## 2023-06-14 DIAGNOSIS — F419 Anxiety disorder, unspecified: Secondary | ICD-10-CM | POA: Diagnosis not present

## 2023-06-14 DIAGNOSIS — Z0000 Encounter for general adult medical examination without abnormal findings: Secondary | ICD-10-CM | POA: Diagnosis not present

## 2023-06-14 DIAGNOSIS — Z23 Encounter for immunization: Secondary | ICD-10-CM | POA: Diagnosis not present

## 2023-07-27 DIAGNOSIS — F419 Anxiety disorder, unspecified: Secondary | ICD-10-CM | POA: Diagnosis not present

## 2023-07-27 DIAGNOSIS — F32A Depression, unspecified: Secondary | ICD-10-CM | POA: Diagnosis not present

## 2023-07-27 DIAGNOSIS — F952 Tourette's disorder: Secondary | ICD-10-CM | POA: Diagnosis not present

## 2023-07-27 DIAGNOSIS — F902 Attention-deficit hyperactivity disorder, combined type: Secondary | ICD-10-CM | POA: Diagnosis not present

## 2023-08-17 DIAGNOSIS — F952 Tourette's disorder: Secondary | ICD-10-CM | POA: Diagnosis not present

## 2023-08-17 DIAGNOSIS — F331 Major depressive disorder, recurrent, moderate: Secondary | ICD-10-CM | POA: Diagnosis not present

## 2023-08-17 DIAGNOSIS — F411 Generalized anxiety disorder: Secondary | ICD-10-CM | POA: Diagnosis not present

## 2023-08-17 DIAGNOSIS — F902 Attention-deficit hyperactivity disorder, combined type: Secondary | ICD-10-CM | POA: Diagnosis not present

## 2024-10-02 ENCOUNTER — Ambulatory Visit
Admission: EM | Admit: 2024-10-02 | Discharge: 2024-10-02 | Disposition: A | Discharge status: Home / Self Care | Attending: Physician Assistant | Admitting: Physician Assistant

## 2024-10-02 ENCOUNTER — Other Ambulatory Visit: Payer: Self-pay

## 2024-10-02 DIAGNOSIS — L818 Other specified disorders of pigmentation: Secondary | ICD-10-CM | POA: Diagnosis not present

## 2024-10-02 DIAGNOSIS — R238 Other skin changes: Secondary | ICD-10-CM

## 2024-10-02 MED ORDER — MUPIROCIN 2 % EX OINT: 1.0000 | TOPICAL_OINTMENT | Freq: Two times a day (BID) | CUTANEOUS | 0 refills | Status: AC

## 2024-10-02 NOTE — Discharge Instructions (Addendum)
 You were seen today for concerns of irritation and redness along your new tattoo.  At this time I suspect you may have some superficial skin irritation as well as a potential mild infection.  To help with management I am starting you on  an antibiotic ointment called mupirocin .  Please apply this to the affected area twice per day for 7 days.  If you notice increased redness, tenderness, drainage that looks like pus, skin breakdown or blisters please return here or follow-up with your primary care provider.  You can keep the area clean with warm water and gentle soap and then pat to dry.  I do recommend moisturizing the area with Aquaphor or Vaseline to help prevent further skin irritation and increase the longevity of your tattoo.

## 2024-10-02 NOTE — ED Triage Notes (Addendum)
 Pt presents with concerns of possible infection on his posterior right upper arm. States he got a tattoo on Tuesday 1/20. Began to notice crusting of the skin in area of new tattoo about four days ago. Neosporin applied. Unsure if this has helped. Only voicing tenderness to the touch in center of tattoo in triage.

## 2024-10-02 NOTE — ED Provider Notes (Signed)
 " [facility]    CSN: [id number] Arrival date & time: 10/02/24  1029      History   Chief Complaint Chief Complaint  Patient presents with   Skin Problem    HPI Clifford Quinn is a 24 y.o. male.   HPI  Pt is here today with concerns for potential skin infection at a recent tattoo site. He reports the tattoo was applied about a week ago and does not seem to be healing normally.  He reports it has stayed tender and red longer than his previous tattoos. He denies purulent drainage, skin breakdown, erythema of surrounding areas, fever.  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 Authorizing Provider  mupirocin  ointment (BACTROBAN ) 2 % Apply 1 Application topically 2 (two) times daily for 7 days. 10/02/24 10/09/24 Yes Ladon Vandenberghe, Rocky BRAVO, PA-C    Family History Family History  Problem Relation Age of Onset   Cancer Paternal Grandfather    Colon cancer Neg Hx    Esophageal cancer Neg Hx    Pancreatic cancer Neg Hx    Stomach cancer Neg Hx     Social History Social History[1]   Allergies   Patient has no known allergies.   Review of Systems Review of Systems   Physical Exam Triage Vital Signs ED Triage Vitals  Encounter Vitals Group     BP 10/02/24 1046 122/78     Girls Systolic BP Percentile --      Girls Diastolic BP Percentile --      Boys Systolic BP Percentile --      Boys Diastolic BP Percentile --      Pulse Rate 10/02/24 1046 68     Resp 10/02/24 1046 18     Temp 10/02/24 1046 98.3 F (36.8 C)     Temp Source 10/02/24 1046 Oral      SpO2 10/02/24 1046 96 %     Weight 10/02/24 1047 200 lb (90.7 kg)     Height 10/02/24 1047 5' 10 (1.778 m)     Head Circumference --      Peak Flow --      Pain Score 10/02/24 1044 0     Pain Loc --      Pain Education --      Exclude from Growth Chart --    No data found.  Updated Vital Signs BP 122/78 (BP Location: Left Arm)   Pulse 68   Temp 98.3 F (36.8 C) (Oral)   Resp 18   Ht 5' 10 (1.778 m)   Wt 200 lb (90.7 kg)   SpO2 96%   BMI 28.70 kg/m   Visual Acuity Right Eye Distance:   Left Eye Distance:   Bilateral Distance:    Right Eye Near:   Left Eye Near:    Bilateral Near:     Physical Exam Vitals reviewed.  Constitutional:      General: He is awake.     Appearance: Normal appearance. He is well-developed and well-groomed.  HENT:     Head: Normocephalic and atraumatic.  Eyes:     Extraocular Movements: Extraocular movements intact.     Conjunctiva/sclera: Conjunctivae normal.  Pulmonary:     Effort: Pulmonary effort is normal.  Musculoskeletal:     Cervical back: Normal range of motion.  Skin:    General: Skin is warm and dry.      Neurological:     Mental Status: He is alert and oriented to person, place, and time.  Psychiatric:        Attention and Perception: Attention normal.        Mood and Affect: Mood normal.        Speech: Speech normal.        Behavior: Behavior normal. Behavior is cooperative.      UC Treatments / Results  Labs (all labs ordered are listed, but only abnormal results are displayed) Labs Reviewed - No data to display  EKG   Radiology No results found.  Procedures Procedures (including critical care time)  Medications Ordered in UC Medications - No data to display  Initial Impression / Assessment and Plan / UC Course  I have reviewed the triage vital signs and the nursing notes.  Pertinent labs & imaging results that were available during my care of the patient were reviewed by me and considered in  my medical decision making (see chart for details).      Final Clinical Impressions(s) / UC Diagnoses   Final diagnoses:  Tattoo of skin  Skin irritation     Discharge Instructions      You were seen today for concerns of irritation and redness along your new tattoo.  At this time I suspect you may have some superficial skin irritation as well as a potential mild infection.  To help with management I am starting you on  an antibiotic ointment called mupirocin .  Please apply this to the affected area twice per day for 7 days.  If you notice increased redness, tenderness, drainage that looks like pus, skin breakdown or blisters please return here or follow-up with your primary care provider.  You can keep the area clean with warm water and gentle soap and then pat to dry.  I do recommend moisturizing the area with Aquaphor or Vaseline to help prevent further skin irritation and increase the longevity of your tattoo.     ED Prescriptions     Medication Sig Dispense Auth. Provider   mupirocin  ointment (BACTROBAN ) 2 % Apply 1 Application topically 2 (two) times daily for 7 days. 22 g Nesha Counihan E, PA-C      PDMP not reviewed this encounter.       [1] Social History Tobacco Use   Smoking status: Never   Smokeless tobacco: Never  Vaping Use   Vaping status: Never Used  Substance Use Topics   Alcohol use: Yes    Comment: OCC   Drug use: Never  "
